# Patient Record
Sex: Female | Born: 1985 | Race: White | Hispanic: No | Marital: Married | Smoking: Never smoker
Health system: Southern US, Community
[De-identification: ages and names within clinical notes are randomized; demographics above are authoritative.]

---

## 2017-09-03 ENCOUNTER — Ambulatory Visit: Payer: Self-pay | Admitting: Adult Health

## 2017-10-25 ENCOUNTER — Emergency Department
Admission: EM | Admit: 2017-10-25 | Discharge: 2017-10-25 | Disposition: A | Discharge status: Home / Self Care | Payer: BLUE CROSS/BLUE SHIELD | Attending: Emergency Medicine | Admitting: Emergency Medicine

## 2017-10-25 ENCOUNTER — Telehealth: Payer: Self-pay | Admitting: *Deleted

## 2017-10-25 ENCOUNTER — Encounter: Payer: Self-pay | Admitting: Emergency Medicine

## 2017-10-25 ENCOUNTER — Emergency Department: Payer: BLUE CROSS/BLUE SHIELD

## 2017-10-25 ENCOUNTER — Other Ambulatory Visit: Payer: Self-pay

## 2017-10-25 ENCOUNTER — Ambulatory Visit: Payer: Self-pay | Admitting: Medical

## 2017-10-25 DIAGNOSIS — R112 Nausea with vomiting, unspecified: Secondary | ICD-10-CM | POA: Diagnosis not present

## 2017-10-25 DIAGNOSIS — R1011 Right upper quadrant pain: Secondary | ICD-10-CM | POA: Diagnosis present

## 2017-10-25 DIAGNOSIS — N289 Disorder of kidney and ureter, unspecified: Secondary | ICD-10-CM | POA: Diagnosis not present

## 2017-10-25 LAB — COMPREHENSIVE METABOLIC PANEL
ALBUMIN: 4.2 g/dL (ref 3.5–5.0)
ALT: 18 U/L (ref 14–54)
AST: 20 U/L (ref 15–41)
Alkaline Phosphatase: 49 U/L (ref 38–126)
Anion gap: 5 (ref 5–15)
BUN: 11 mg/dL (ref 6–20)
CHLORIDE: 107 mmol/L (ref 101–111)
CO2: 26 mmol/L (ref 22–32)
Calcium: 9.1 mg/dL (ref 8.9–10.3)
Creatinine, Ser: 0.75 mg/dL (ref 0.44–1.00)
GFR calc Af Amer: >60 mL/min (ref >60)
Glucose, Bld: 120 mg/dL — ABNORMAL HIGH (ref 65–99)
POTASSIUM: 4.7 mmol/L (ref 3.5–5.1)
Sodium: 138 mmol/L (ref 135–145)
Total Bilirubin: 1.6 mg/dL — ABNORMAL HIGH (ref 0.3–1.2)
Total Protein: 7.5 g/dL (ref 6.5–8.1)

## 2017-10-25 LAB — CBC
HEMATOCRIT: 40.8 % (ref 35.0–47.0)
HEMOGLOBIN: 13.8 g/dL (ref 12.0–16.0)
MCH: 30.8 pg (ref 26.0–34.0)
MCHC: 33.8 g/dL (ref 32.0–36.0)
MCV: 90.9 fL (ref 80.0–100.0)
Platelets: 262 10*3/uL (ref 150–440)
RBC: 4.48 MIL/uL (ref 3.80–5.20)
RDW: 13.2 % (ref 11.5–14.5)
WBC: 13.9 10*3/uL — AB (ref 3.6–11.0)

## 2017-10-25 LAB — URINALYSIS, COMPLETE (UACMP) WITH MICROSCOPIC
BACTERIA UA: NONE SEEN
BILIRUBIN URINE: NEGATIVE
Glucose, UA: NEGATIVE mg/dL
HGB URINE DIPSTICK: NEGATIVE
Ketones, ur: NEGATIVE mg/dL
LEUKOCYTES UA: NEGATIVE
Nitrite: NEGATIVE
PROTEIN: 30 mg/dL — AB
SPECIFIC GRAVITY, URINE: 1.019 (ref 1.005–1.030)
pH: 7 (ref 5.0–8.0)

## 2017-10-25 LAB — POCT PREGNANCY, URINE: PREG TEST UR: NEGATIVE

## 2017-10-25 LAB — LIPASE, BLOOD: LIPASE: 31 U/L (ref 11–51)

## 2017-10-25 MED ORDER — OXYCODONE-ACETAMINOPHEN 5-325 MG PO TABS: 1.0000 | ORAL_TABLET | ORAL | 0 refills | Status: AC | PRN

## 2017-10-25 MED ORDER — OMEPRAZOLE 40 MG PO CPDR: 40.0000 mg | DELAYED_RELEASE_CAPSULE | Freq: Every day | ORAL | 0 refills | Status: AC

## 2017-10-25 MED ORDER — ONDANSETRON 4 MG PO TBDP: 4.0000 mg | ORAL_TABLET | Freq: Three times a day (TID) | ORAL | 0 refills | Status: AC | PRN

## 2017-10-25 NOTE — ED Notes (Signed)
Pt verbalized understanding of discharge instructions. NAD at this time. 

## 2017-10-25 NOTE — Telephone Encounter (Signed)
10/25/17 1520- Pt called clinic ([facility]) while waiting to be seen in ED @ [facility]. Pt states she went to ED this morning with N/V/ abdominal pain. States she had triage and labs drawn immediately and has been waiting to see a physician for 6 hours and is thinking about leaving. She wants to know what we could do/ have done here in the clinic vs her going to ED. Advised pt we can do labs, however they are sent to [facility] and we do not get results for a day, and any CT scans/ US have to be scheduled and sometimes it is difficult to get in same day. Further advised no walk-in clinic would treat her with her symptoms, they would tell her to go to ED. Empathized with pt and highly encouraged her to stick it out and wait to be evaluated by physician. Pt states nurse informed her WBC was elevated and she still has abdominal tenderness. Again, with this information, highly encouraged pt to stay and be evaluated by physician as she may need CT scan or US to rule out appendicitis or other infection like diverticulitis, and to remain NPO. Pt verbalizes understanding and states she will try to wait. Advised pt we can see her for follow up if needed as she is new to the area and does not have a PCP.

## 2017-10-25 NOTE — ED Provider Notes (Signed)
[facility] Emergency Department Provider Note  ____________________________________________  Time seen: Approximately 3:44 PM  I have reviewed the triage vital signs and the nursing notes.   HISTORY  Chief Complaint Abdominal Pain and Emesis    HPI Leah Gibson is a 32 y.o. female, nonpregnant, presenting with right upper quadrant pain, nausea and vomiting.  The patient reports that over the last 2 months, she has developed sharp stabbing pains in the right upper quadrant, which she attributed to a zinc supplement and has since stopped that medication.  Last night, the patient ate pizza for dinner and then developed "a normal belly ache."  She went to sleep but throughout the night was awakened multiple times due to right upper quadrant pain.  This morning, her pain was so severe that she was doubled over and unable to help her children get ready for school.  She then developed nausea and vomiting.  At this time, the patient's symptoms have significantly improved, without intervention.  She has not had any diarrhea or constipation, fever or chills, dysuria or change in vaginal discharge.  History reviewed. No pertinent past medical history.  There are no active problems to display for this patient.   History reviewed. No pertinent surgical history.    Allergies Codeine  No family history on file.  Social History Social History   Tobacco Use  . Smoking status: Never Smoker  . Smokeless tobacco: Never Used  Substance Use Topics  . Alcohol use: Not Currently  . Drug use: Not on file    Review of Systems Constitutional: No fever/chills.  No lightheadedness or syncope. Eyes: No visual changes. ENT: No sore throat. No congestion or rhinorrhea. Cardiovascular: Denies chest pain. Denies palpitations. Respiratory: Denies shortness of breath.  No cough. Gastrointestinal: Positive right upper quadrant abdominal pain.  Positive nausea, positive vomiting.   No diarrhea.  No constipation. Genitourinary: Negative for dysuria.  No change in vaginal discharge. Musculoskeletal: Negative for back pain. Skin: Negative for rash. Neurological: Negative for headaches. No focal numbness, tingling or weakness.     ____________________________________________   PHYSICAL EXAM:  VITAL SIGNS: ED Triage Vitals  Enc Vitals Group     BP 10/25/17 0906 125/72     Pulse Rate 10/25/17 0906 73     Resp 10/25/17 0906 16     Temp 10/25/17 0906 98 F (36.7 C)     Temp Source 10/25/17 0906 Oral     SpO2 10/25/17 0906 100 %     Weight 10/25/17 0907 170 lb (77.1 kg)     Height 10/25/17 0907 5\' 6"  (1.676 m)     Head Circumference --      Peak Flow --      Pain Score 10/25/17 0906 8     Pain Loc --      Pain Edu? --      Excl. in GC? --     Constitutional: Alert and oriented. Well appearing and in no acute distress. Answers questions appropriately. Eyes: Conjunctivae are normal.  EOMI. No scleral icterus. Head: Atraumatic. Nose: No congestion/rhinnorhea. Mouth/Throat: Mucous membranes are moist.  Neck: No stridor.  Supple.   Cardiovascular: Normal rate, regular rhythm. No murmurs, rubs or gallops.  Respiratory: Normal respiratory effort.  No accessory muscle use or retractions. Lungs CTAB.  No wheezes, rales or ronchi. Gastrointestinal: Soft, and nondistended.  Tender to palpation in the right upper quadrant with positive Murphy sign.  No guarding or rebound.  No peritoneal signs. Musculoskeletal: No LE  edema. No ttp in the calves or palpable cords.  Negative Homan's sign. Neurologic:  A&Ox3.  Speech is clear.  Face and smile are symmetric.  EOMI.  Moves all extremities well. Skin:  Skin is warm, dry and intact. No rash noted. Psychiatric: Mood and affect are normal. Speech and behavior are normal.  Normal judgement  ____________________________________________   LABS (all labs ordered are listed, but only abnormal results are displayed)  Labs  Reviewed  COMPREHENSIVE METABOLIC PANEL - Abnormal; Notable for the following components:      Result Value   Glucose, Bld 120 (*)    Total Bilirubin 1.6 (*)    All other components within normal limits  CBC - Abnormal; Notable for the following components:   WBC 13.9 (*)    All other components within normal limits  URINALYSIS, COMPLETE (UACMP) WITH MICROSCOPIC - Abnormal; Notable for the following components:   Color, Urine YELLOW (*)    APPearance CLEAR (*)    Protein, ur 30 (*)    Squamous Epithelial / LPF 0-5 (*)    All other components within normal limits  LIPASE, BLOOD  POC URINE PREG, ED  POCT PREGNANCY, URINE   ____________________________________________  EKG  Not indicated ____________________________________________  RADIOLOGY  [place] Abdomen Limited Ruq  Result Date: 10/25/2017 CLINICAL DATA:  Right upper quadrant pain EXAM: ULTRASOUND ABDOMEN LIMITED RIGHT UPPER QUADRANT COMPARISON:  None. FINDINGS: Gallbladder: No gallstones or gallbladder wall thickening. No pericholecystic fluid. The sonographer reports no sonographic Murphy's sign. Common bile duct: Diameter: 3 mm Liver: No focal lesion identified. Within normal limits in parenchymal echogenicity. Portal vein is patent on color Doppler imaging with normal direction of blood flow towards the liver. Other: 18 mm focal lesion identified in the right kidney which cannot be further characterized by ultrasound. IMPRESSION: 1. Unremarkable gallbladder. 2. 18 mm right renal lesion visualize incidentally. This cannot be definitively characterized by ultrasound. MRI of the abdomen without and with contrast could be used to further evaluate. Electronically Signed   By: Kennith CenterEric  Mansell M.D.   On: 10/25/2017 16:37    ____________________________________________   PROCEDURES  Procedure(s) performed: None  Procedures  Critical Care performed: No ____________________________________________   INITIAL IMPRESSION / ASSESSMENT  AND PLAN / ED COURSE  Pertinent labs & imaging results that were available during my care of the patient were reviewed by me and considered in my medical decision making (see chart for details).  32 y.o. nonpregnant female presenting with a third episode of right upper quadrant pain over the past 2 months, now with nausea and vomiting.  Overall, the patient is well-appearing and hemodynamically stable, afebrile.  She does have right upper quadrant tenderness to palpation I am concerned about gallbladder pathology.  An ultrasound has been ordered.  The patient has a minimally elevated white blood cell count of 13.5 and her bilirubin is 1.6 but the remainder of her laboratory studies are reassuring.  I have offered the patient pain and nausea control, and she has deferred that she is feeling so much better at this time.  ----------------------------------------- 4:44 PM on 10/25/2017 -----------------------------------------  The patient's ultrasound does not show any gallbladder pathology.  There is an 18 mm ill-defined renal lesion on the right, which I have told the patient about enrolling her discharge paperwork so that she can have it followed up by her primary care physician.  At this time, the patient is asymptomatic.  I will initiate her on omeprazole for possible reflux or ulcer disease, and  have her follow-up with her primary care physician for further evaluation and treatment.  Return precautions as well as follow-up instructions were discussed.  ____________________________________________  FINAL CLINICAL IMPRESSION(S) / ED DIAGNOSES  Final diagnoses:  Right upper quadrant pain  Renal lesion  Nausea and vomiting, intractability of vomiting not specified, unspecified vomiting type         NEW MEDICATIONS STARTED DURING THIS VISIT:  New Prescriptions   OMEPRAZOLE (PRILOSEC) 40 MG CAPSULE    Take 1 capsule (40 mg total) by mouth daily.   ONDANSETRON (ZOFRAN ODT) 4 MG  DISINTEGRATING TABLET    Take 1 tablet (4 mg total) by mouth every 8 (eight) hours as needed for nausea or vomiting.   OXYCODONE-ACETAMINOPHEN (PERCOCET) 5-325 MG TABLET    Take 1 tablet by mouth every 4 (four) hours as needed for severe pain.      Rockne Menghini, MD 10/25/17 [phone]

## 2017-10-25 NOTE — ED Notes (Signed)
First Nurse Note:  VS repeated, no acute changes.  Wait explained to patient.  No change since arrival.

## 2017-10-25 NOTE — ED Notes (Signed)
Pt stating sx started last night and progressively worsened. Pt stating that she could barely stand up and was vomiting. Pt stating that she has had similar episodes x2. Pt stating that she had it last a couple weeks ago. Pt stating that this time was the most intense. Pt stating that her pain has decreased at this time. Pt stating mild nausea. Pt stating she ate pizza last night prior to pain starting. Emesis was clear "mostly dry heaving." Pt stating that she at a "bite of a protein bar" about one hour ago.

## 2017-10-25 NOTE — ED Triage Notes (Signed)
Pt states upper abdominal cramping/pain since 9pm last night, nauseated and vomiting as well. States this has happened every few weeks now, pt does have her gallbladder.

## 2017-10-25 NOTE — Discharge Instructions (Signed)
Please take a bland diet and start omeprazole today.  Keep a food diary which marks any symptoms that you may have.  Omeprazole is for possible reflux or ulcer, and Zofran is for nausea and vomiting.  Today, your ultrasound did show an 18 mm lesion on your right kidney.  This will need to be reevaluated by her primary care physician and will require additional imaging, likely MRI.  Return to the emergency department if you develop severe pain, inability to keep down fluids, fever, or any other symptoms concerning to you.

## 2017-11-02 ENCOUNTER — Other Ambulatory Visit: Payer: Self-pay | Admitting: Internal Medicine

## 2017-11-02 DIAGNOSIS — N289 Disorder of kidney and ureter, unspecified: Secondary | ICD-10-CM

## 2017-11-02 DIAGNOSIS — R1013 Epigastric pain: Secondary | ICD-10-CM

## 2017-11-08 ENCOUNTER — Ambulatory Visit
Admission: RE | Admit: 2017-11-08 | Discharge: 2017-11-08 | Disposition: A | Discharge status: Home / Self Care | Payer: BLUE CROSS/BLUE SHIELD | Source: Ambulatory Visit | Attending: Internal Medicine | Admitting: Internal Medicine

## 2017-11-08 DIAGNOSIS — N289 Disorder of kidney and ureter, unspecified: Secondary | ICD-10-CM

## 2017-11-08 DIAGNOSIS — R1013 Epigastric pain: Secondary | ICD-10-CM | POA: Diagnosis present

## 2017-11-08 DIAGNOSIS — N2889 Other specified disorders of kidney and ureter: Secondary | ICD-10-CM | POA: Insufficient documentation

## 2017-11-08 MED ORDER — GADOBENATE DIMEGLUMINE 529 MG/ML IV SOLN
15.0000 mL | Freq: Once | INTRAVENOUS | Status: AC | PRN
  Administered 2017-11-08: 15 mL via INTRAVENOUS

## 2019-01-26 IMAGING — MR MR ABDOMEN WO/W CM
11 of 17 series · 30 of 48 positions shown · IV contrast (15mL multihance)
Comparison: Ultrasound on 10/25/2017

CLINICAL DATA: Indeterminate right renal mass on recent ultrasound.

EXAM:
MRI ABDOMEN WITHOUT AND WITH CONTRAST
TECHNIQUE: Multiplanar multisequence MR imaging of the abdomen was performed
both before and after the administration of intravenous contrast.
CONTRAST:  15mL MULTIHANCE GADOBENATE DIMEGLUMINE 529 MG/ML IV SOLN

[Series 2: T2 · coronal · 8.0mm · 0.78mm/px · 2 of 22 slices shown]
[im 1/22]
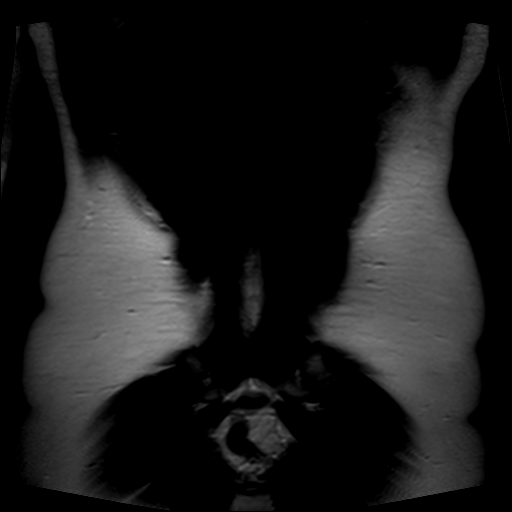
[im 22/22]
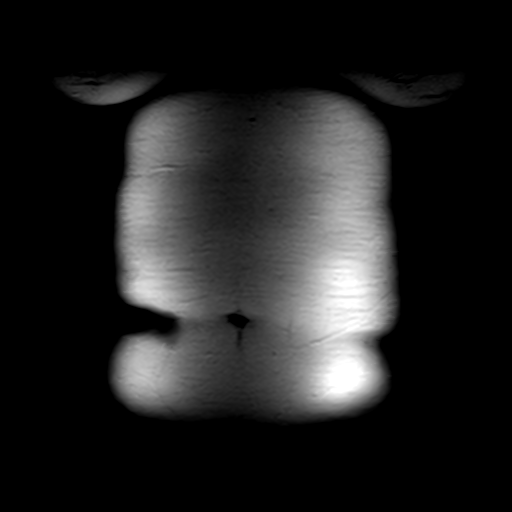

[Series 3: T2 fat-sat · axial · 7.0mm · 0.74mm/px · z∈[-33,+118]mm · 2 of 19 slices shown]
[im 1/19]
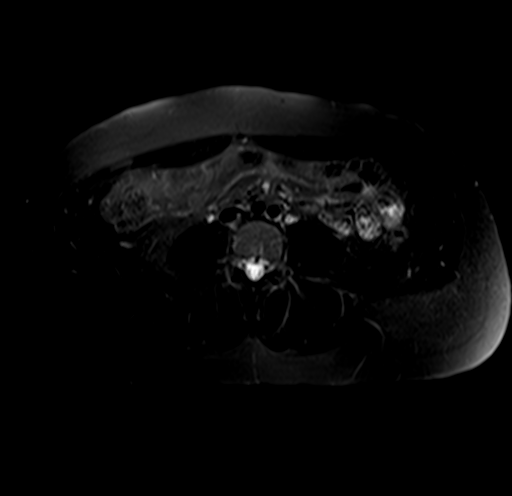
[im 19/19]
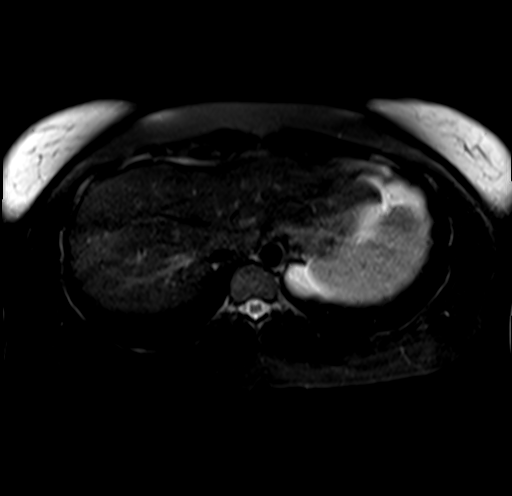

[Series 4: T1 · axial · 7.0mm · 0.74mm/px · z∈[-33,+118]mm · 3 of 38 slices shown]
[im 1/38]
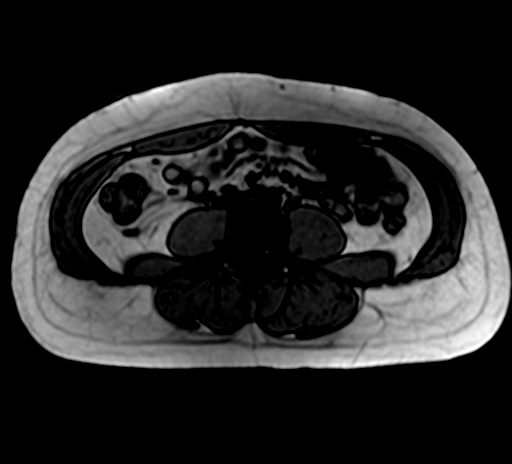
[im 19/38]
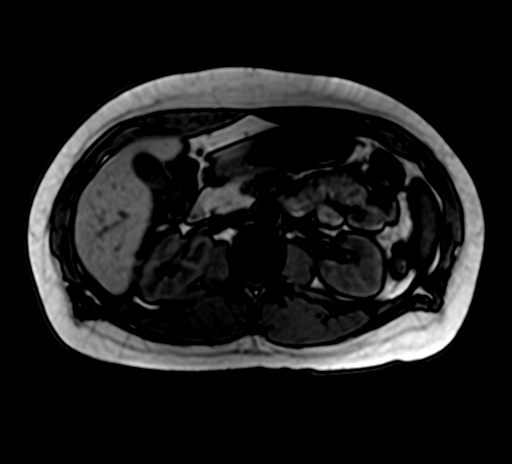
[im 38/38]
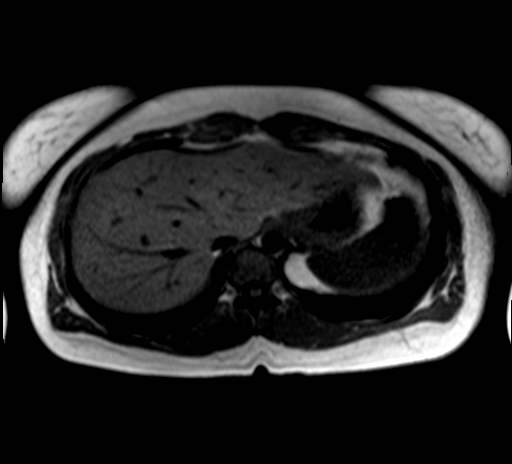

[Series 6: DWI · axial · 6.0mm · 1.98mm/px · z∈[-62,+147]mm · 6 of 90 slices shown]
[im 1/90]
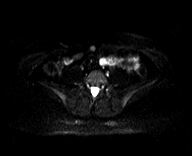
[im 18/90]
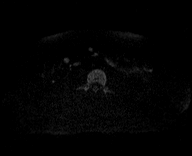
[im 36/90]
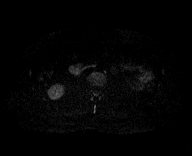
[im 54/90]
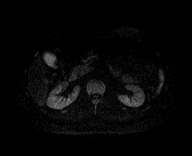
[im 72/90]
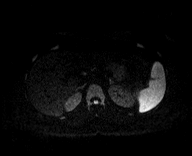
[im 90/90]
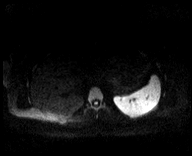

[Series 7: ax dwi_adc · axial · 6.0mm · 1.98mm/px · z∈[-62,+147]mm · 2 of 30 slices shown]
[im 1/30]
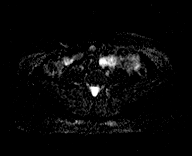
[im 30/30]
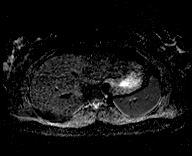

[Series 8: bSSFP · axial · 4.0mm · 0.74mm/px · z∈[-36,+120]mm · 2 of 40 slices shown]
[im 1/40]
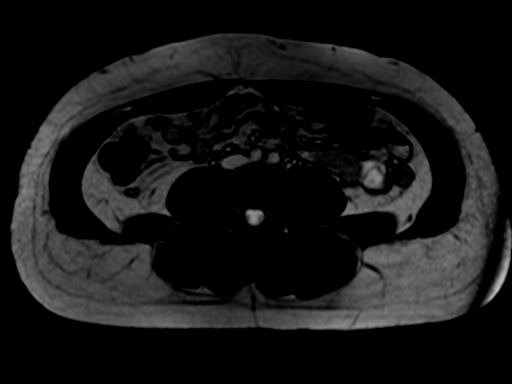
[im 40/40]
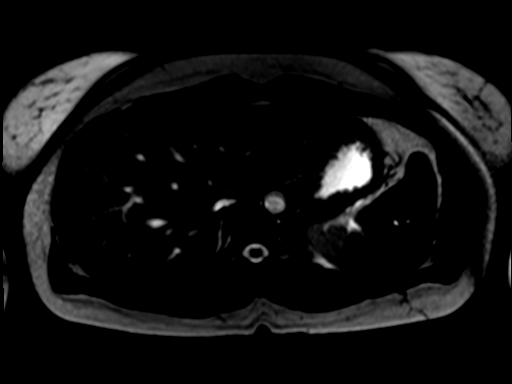

[Series 9: T1 dynamic fat-sat · axial · non-contrast · 2.5mm · 0.74mm/px · z∈[-36,+121]mm · 3 of 64 slices shown (1 of 3)]
[im 1/64]
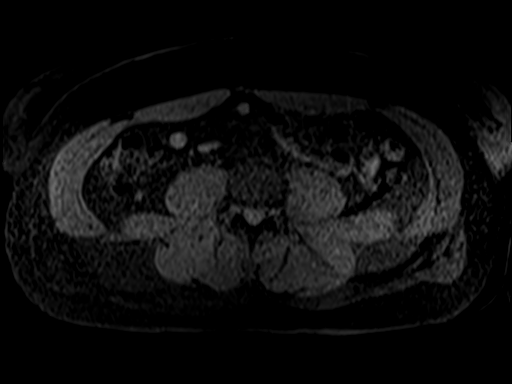
[im 32/64]
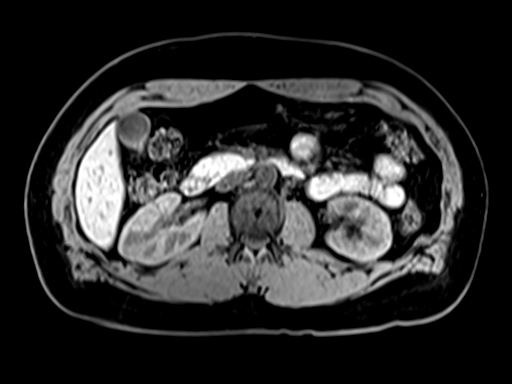
[im 64/64]
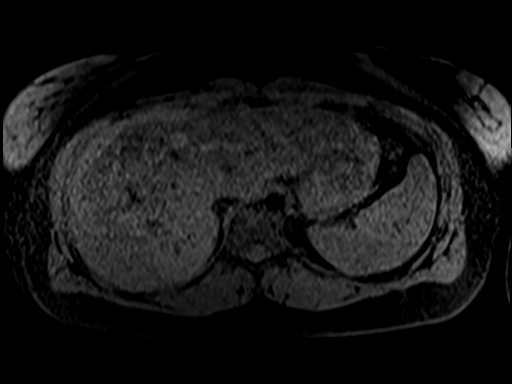

[Series 10: T1 dynamic fat-sat post-contrast · axial · 2.5mm · 0.74mm/px · z∈[-36,+121]mm · 3 of 64 slices shown (1 of 2)]
[im 1/64]
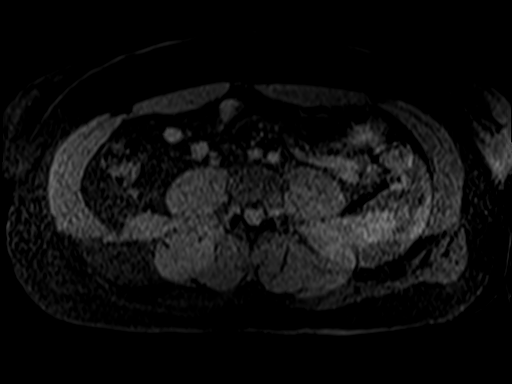
[im 32/64]
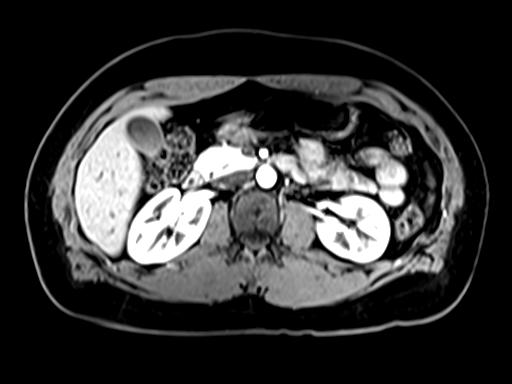
[im 64/64]
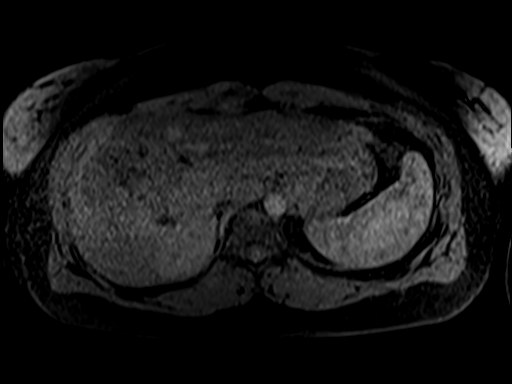

[Series 11: T1 dynamic fat-sat · axial · 2.5mm · 0.74mm/px · z∈[-36,+121]mm · 3 of 64 slices shown (2 of 3)]
[im 1/64]
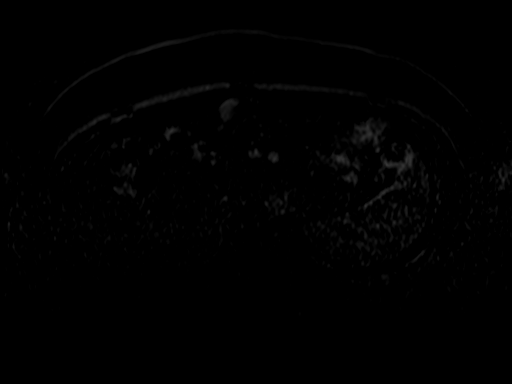
[im 32/64]
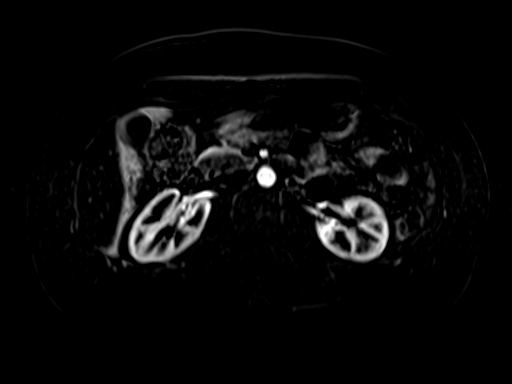
[im 64/64]
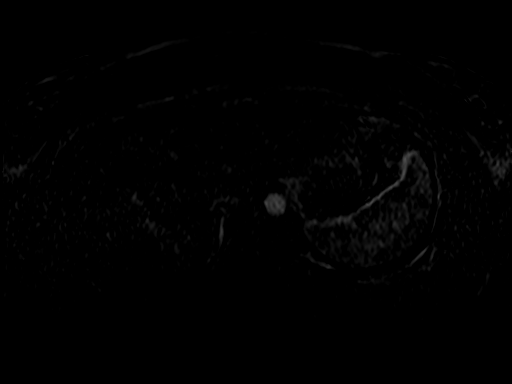

[Series 12: T1 dynamic fat-sat post-contrast · axial · 2.5mm · 0.74mm/px · z∈[-36,+121]mm · 3 of 64 slices shown (2 of 2)]
[im 1/64]
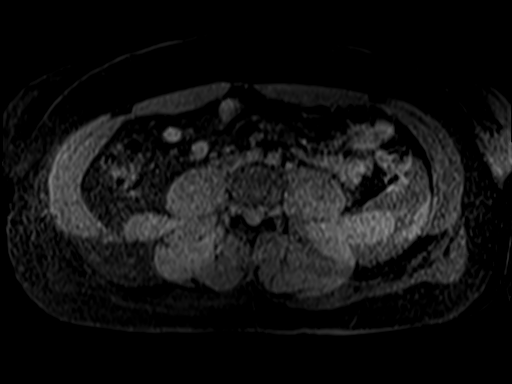
[im 32/64]
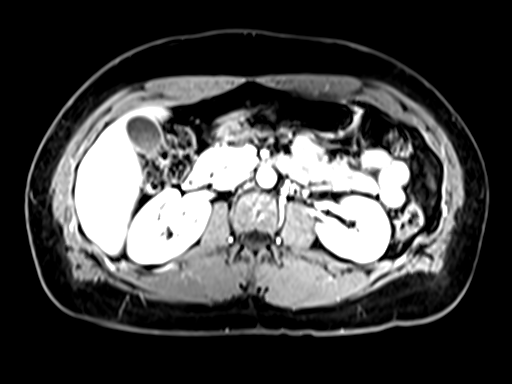
[im 64/64]
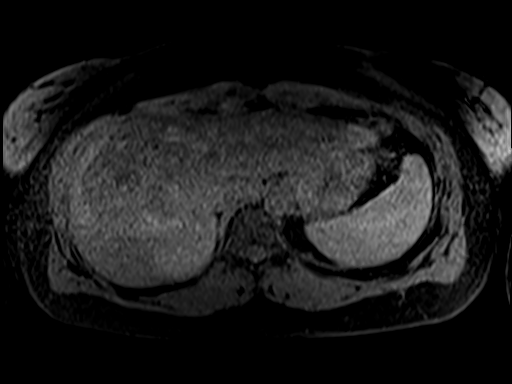

[Series 13: T1 dynamic fat-sat · axial · 2.5mm · 0.74mm/px · 1 of 64 slices shown (3 of 3)]
[im 1/64]
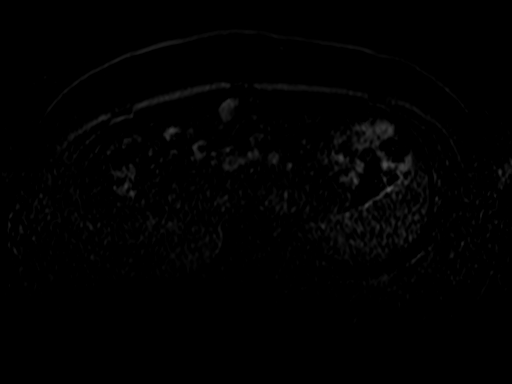

[30 of 48 positions shown; findings below may reference images not displayed]

FINDINGS: Lower chest: No acute findings.

Hepatobiliary: No hepatic masses identified. Gallbladder is
unremarkable. No evidence of biliary ductal dilatation.

Pancreas:  No mass or inflammatory changes.

Spleen:  Within normal limits in size and appearance.

Adrenals/Urinary Tract: Normal appearance of both adrenal glands. No
renal masses or other parenchymal lesions identified.. No evidence
of hydronephrosis.

Stomach/Bowel: Visualized portion unremarkable.

Vascular/Lymphatic: No pathologically enlarged lymph nodes
identified. No abdominal aortic aneurysm.

Other:  None.

Musculoskeletal:  No suspicious bone lesions identified.
IMPRESSION: Negative. No evidence of renal mass, hydronephrosis, or other
significant abnormality.
